# Patient Record
Sex: Female | Born: 1980 | Race: White | Hispanic: No | Marital: Married | Smoking: Never smoker
Health system: Southern US, Community
[De-identification: ages and names within clinical notes are randomized; demographics above are authoritative.]

## PROBLEM LIST (undated history)

## (undated) DIAGNOSIS — F17210 Nicotine dependence, cigarettes, uncomplicated: Secondary | ICD-10-CM

## (undated) DIAGNOSIS — F419 Anxiety disorder, unspecified: Secondary | ICD-10-CM

## (undated) DIAGNOSIS — R002 Palpitations: Secondary | ICD-10-CM

## (undated) DIAGNOSIS — N939 Abnormal uterine and vaginal bleeding, unspecified: Secondary | ICD-10-CM

## (undated) DIAGNOSIS — N83209 Unspecified ovarian cyst, unspecified side: Secondary | ICD-10-CM

## (undated) DIAGNOSIS — Z6823 Body mass index (BMI) 23.0-23.9, adult: Secondary | ICD-10-CM

## (undated) HISTORY — DX: Anxiety disorder, unspecified: F41.9

## (undated) HISTORY — PX: TUBAL LIGATION: SHX77

## (undated) HISTORY — DX: Nicotine dependence, cigarettes, uncomplicated: F17.210

## (undated) HISTORY — DX: Palpitations: R00.2

## (undated) HISTORY — DX: Body mass index (BMI) 23.0-23.9, adult: Z68.23

## (undated) HISTORY — DX: Abnormal uterine and vaginal bleeding, unspecified: N93.9

---

## 2012-07-04 ENCOUNTER — Encounter (HOSPITAL_COMMUNITY): Payer: Self-pay | Admitting: *Deleted

## 2012-07-04 ENCOUNTER — Emergency Department (HOSPITAL_COMMUNITY)
Admission: EM | Admit: 2012-07-04 | Discharge: 2012-07-04 | Disposition: A | Discharge status: Home / Self Care | Payer: Self-pay | Attending: Emergency Medicine | Admitting: Emergency Medicine

## 2012-07-04 ENCOUNTER — Emergency Department (HOSPITAL_COMMUNITY): Payer: Self-pay

## 2012-07-04 DIAGNOSIS — R110 Nausea: Secondary | ICD-10-CM | POA: Insufficient documentation

## 2012-07-04 DIAGNOSIS — N83209 Unspecified ovarian cyst, unspecified side: Secondary | ICD-10-CM | POA: Insufficient documentation

## 2012-07-04 HISTORY — DX: Unspecified ovarian cyst, unspecified side: N83.209

## 2012-07-04 LAB — URINALYSIS, ROUTINE W REFLEX MICROSCOPIC
Bilirubin Urine: NEGATIVE
Glucose, UA: NEGATIVE mg/dL
Hgb urine dipstick: NEGATIVE
Ketones, ur: NEGATIVE mg/dL
Leukocytes, UA: NEGATIVE
Nitrite: NEGATIVE
Protein, ur: NEGATIVE mg/dL
Specific Gravity, Urine: 1.023 (ref 1.005–1.030)
Urobilinogen, UA: 1 mg/dL (ref 0.0–1.0)
pH: 5 (ref 5.0–8.0)

## 2012-07-04 LAB — COMPREHENSIVE METABOLIC PANEL
ALT: 10 U/L (ref 0–35)
AST: 14 U/L (ref 0–37)
Albumin: 3.9 g/dL (ref 3.5–5.2)
Alkaline Phosphatase: 43 U/L (ref 39–117)
BUN: 9 mg/dL (ref 6–23)
CO2: 28 mEq/L (ref 19–32)
Calcium: 9.3 mg/dL (ref 8.4–10.5)
Chloride: 106 mEq/L (ref 96–112)
Creatinine, Ser: 0.71 mg/dL (ref 0.50–1.10)
GFR calc Af Amer: >90 mL/min (ref >90)
GFR calc non Af Amer: >90 mL/min (ref >90)
Glucose, Bld: 125 mg/dL — ABNORMAL HIGH (ref 70–99)
Potassium: 3.6 mEq/L (ref 3.5–5.1)
Sodium: 142 mEq/L (ref 135–145)
Total Bilirubin: 0.2 mg/dL — ABNORMAL LOW (ref 0.3–1.2)
Total Protein: 7 g/dL (ref 6.0–8.3)

## 2012-07-04 LAB — CBC WITH DIFFERENTIAL/PLATELET
Eosinophils Absolute: 0 10*3/uL (ref 0.0–0.7)
Lymphocytes Relative: 13 % (ref 12–46)
Lymphs Abs: 1.3 10*3/uL (ref 0.7–4.0)
MCH: 31.4 pg (ref 26.0–34.0)
Neutrophils Relative %: 83 % — ABNORMAL HIGH (ref 43–77)
Platelets: 169 10*3/uL (ref 150–400)
RBC: 4.2 MIL/uL (ref 3.87–5.11)
WBC: 10.6 10*3/uL — ABNORMAL HIGH (ref 4.0–10.5)

## 2012-07-04 MED ORDER — HYDROCODONE-ACETAMINOPHEN 5-325 MG PO TABS: 1.0000 | ORAL_TABLET | Freq: Three times a day (TID) | ORAL | Status: DC | PRN

## 2012-07-04 NOTE — ED Notes (Signed)
Pt brought back to room; vitals taken and pt getting into gown at this time

## 2012-07-04 NOTE — ED Notes (Signed)
Patient verbalized understanding of discharge instructions.  She has been encouraged to return to ED for any s/sx of increased pain, bleeding, dizziness or other concerns

## 2012-07-04 NOTE — ED Notes (Signed)
Patient reports she has noted right lower abd pain for 1 week post intercourse.  Patient states today her pain became significantly worse after sex.   She is also having shooting pain towards her rectum.  Patient states she is concerned that it may be a ruptured ovarian cyst.  Patient also reports nausea.  Patient last po intake was at 1330.   Patient reports normal urine output,  Normal stool.  Patient has hx of tubal ligation as well

## 2012-07-04 NOTE — ED Provider Notes (Signed)
History     CSN: [id number]  Arrival date & time 07/04/12  1533   First MD Initiated Contact with Patient 07/04/12 1547      Chief Complaint  Patient presents with  . Abdominal Pain  . Nausea    (Consider location/radiation/quality/duration/timing/severity/associated sxs/prior treatment) HPI  The patient p/w lower abdominal pain.  Pain began about one week PTA, w no clear precipitant.  Since onset there has been waxing / waning pain in the lower abd R>L.  Pain is worse following intercourse, and the patient has not taken any medication for relief this far. No upper abd pain, no persistent pain, no n/v/d, no vaginal bleeding or discharge. She states that she has a Hx of ovarian cyst rupture in the distant past.  Past Medical History  Diagnosis Date  . Ovarian cyst rupture     Past Surgical History  Procedure Laterality Date  . Tubal ligation      No family history on file.  History  Substance Use Topics  . Smoking status: Never Smoker   . Smokeless tobacco: Not on file  . Alcohol Use: Yes    OB History   Grav Para Term Preterm Abortions TAB SAB Ect Mult Living                  Review of Systems  Constitutional:       Per HPI, otherwise negative  HENT:       Per HPI, otherwise negative  Respiratory:       Per HPI, otherwise negative  Cardiovascular:       Per HPI, otherwise negative  Gastrointestinal: Positive for abdominal pain. Negative for nausea, vomiting and diarrhea.  Endocrine:       Negative aside from HPI  Genitourinary: Positive for pelvic pain. Negative for urgency, frequency, hematuria, flank pain, decreased urine volume, vaginal bleeding, vaginal discharge, vaginal pain and menstrual problem.       Occasional spotting, none recently   Musculoskeletal:       Per HPI, otherwise negative  Skin: Negative.   Neurological: Negative for syncope, weakness and numbness.    Allergies  Review of patient's allergies indicates no known  allergies.  Home Medications  No current outpatient prescriptions on file.  BP 120/80  Pulse 105  Temp(Src) 98.2 F (36.8 C) (Oral)  Resp 17  Ht 5\' 4"  (1.626 m)  Wt 125 lb (56.7 kg)  BMI 21.45 kg/m2  SpO2 100%  Physical Exam  Nursing note and vitals reviewed. Constitutional: She is oriented to person, place, and time. She appears well-developed and well-nourished. No distress.  HENT:  Head: Normocephalic and atraumatic.  Eyes: Conjunctivae and EOM are normal.  Cardiovascular: Normal rate and regular rhythm.   Pulmonary/Chest: Effort normal and breath sounds normal. No stridor. No respiratory distress.  Abdominal: She exhibits no distension. There is no hepatosplenomegaly. There is tenderness in the right lower quadrant, suprapubic area and left lower quadrant. There is no rigidity, no rebound, no guarding and no CVA tenderness.  Musculoskeletal: She exhibits no edema.  Neurological: She is alert and oriented to person, place, and time. No cranial nerve deficit.  Skin: Skin is warm and dry.  Psychiatric: She has a normal mood and affect.    ED Course  Procedures (including critical care time)  Labs Reviewed  URINALYSIS, ROUTINE W REFLEX MICROSCOPIC  CBC WITH DIFFERENTIAL  COMPREHENSIVE METABOLIC PANEL   No results found.   No diagnosis found.  6:06 PM Patient informed of all  results.  She feels well.  She now only complains of pain, mild in her left lower quadrant.  Reviewed ultrasound results with her and her husband.  Also reviewed return precautions, follow up instructions.  MDM  This young female in no distress presents with one day of lower abdominal pain.  On exam the patient is tenderness, but not peritoneal findings.  The patient's denial of any ongoing vaginal bleeding, vomiting, diarrhea, and absence of fever is reassuring for the low suspicion of acute pathology.  Given prescription of dyspareunia, lower abdominal pain, there suspicion of gynecologic etiology.   Patient's ultrasound demonstrates a complex cyst, with appropriate ovarian blood flow.  The patient was counseled on the need for gynecology followup, discharged in stable condition.  Absent any n/v/d there is little suspicion of acute GI pathology concurrently.        Gerhard Munch, MD 07/04/12 [phone]

## 2012-07-04 NOTE — ED Notes (Signed)
Patient reports her last period was the end of january

## 2014-11-18 DIAGNOSIS — R55 Syncope and collapse: Secondary | ICD-10-CM | POA: Insufficient documentation

## 2020-09-02 ENCOUNTER — Other Ambulatory Visit: Payer: Self-pay | Admitting: Family

## 2020-09-02 DIAGNOSIS — N920 Excessive and frequent menstruation with regular cycle: Secondary | ICD-10-CM

## 2020-09-02 DIAGNOSIS — E282 Polycystic ovarian syndrome: Secondary | ICD-10-CM

## 2020-09-06 ENCOUNTER — Other Ambulatory Visit: Payer: Self-pay | Admitting: Family

## 2020-09-06 DIAGNOSIS — Z1231 Encounter for screening mammogram for malignant neoplasm of breast: Secondary | ICD-10-CM

## 2020-09-06 DIAGNOSIS — I82402 Acute embolism and thrombosis of unspecified deep veins of left lower extremity: Secondary | ICD-10-CM

## 2020-09-09 ENCOUNTER — Ambulatory Visit: Payer: Self-pay

## 2020-09-13 ENCOUNTER — Ambulatory Visit
Admission: RE | Admit: 2020-09-13 | Discharge: 2020-09-13 | Disposition: A | Discharge status: Home / Self Care | Payer: 59 | Source: Ambulatory Visit | Attending: Family | Admitting: Family

## 2020-09-13 ENCOUNTER — Other Ambulatory Visit: Payer: Self-pay

## 2020-09-13 DIAGNOSIS — N920 Excessive and frequent menstruation with regular cycle: Secondary | ICD-10-CM

## 2020-09-13 DIAGNOSIS — Z1231 Encounter for screening mammogram for malignant neoplasm of breast: Secondary | ICD-10-CM

## 2020-09-13 DIAGNOSIS — E282 Polycystic ovarian syndrome: Secondary | ICD-10-CM

## 2020-09-16 ENCOUNTER — Other Ambulatory Visit: Payer: Self-pay

## 2020-11-14 DIAGNOSIS — Z789 Other specified health status: Secondary | ICD-10-CM

## 2020-11-14 DIAGNOSIS — N941 Unspecified dyspareunia: Secondary | ICD-10-CM | POA: Insufficient documentation

## 2020-11-14 DIAGNOSIS — N946 Dysmenorrhea, unspecified: Secondary | ICD-10-CM

## 2020-11-14 DIAGNOSIS — N920 Excessive and frequent menstruation with regular cycle: Secondary | ICD-10-CM | POA: Insufficient documentation

## 2020-11-14 DIAGNOSIS — R102 Pelvic and perineal pain: Secondary | ICD-10-CM

## 2021-11-01 IMAGING — MG MM DIGITAL SCREENING BILAT W/ TOMO AND CAD
6 of 10 series · 6 of 30 positions shown · non-contrast
Comparison: Previous exam(s).

CLINICAL DATA: Screening.

EXAM:
DIGITAL SCREENING BILATERAL MAMMOGRAM WITH TOMOSYNTHESIS AND CAD
TECHNIQUE: Bilateral screening digital craniocaudal and mediolateral oblique
mammograms were obtained. Bilateral screening digital breast
tomosynthesis was performed. The images were evaluated with
computer-aided detection.

[R MLO synth-2D (1 of 2)]
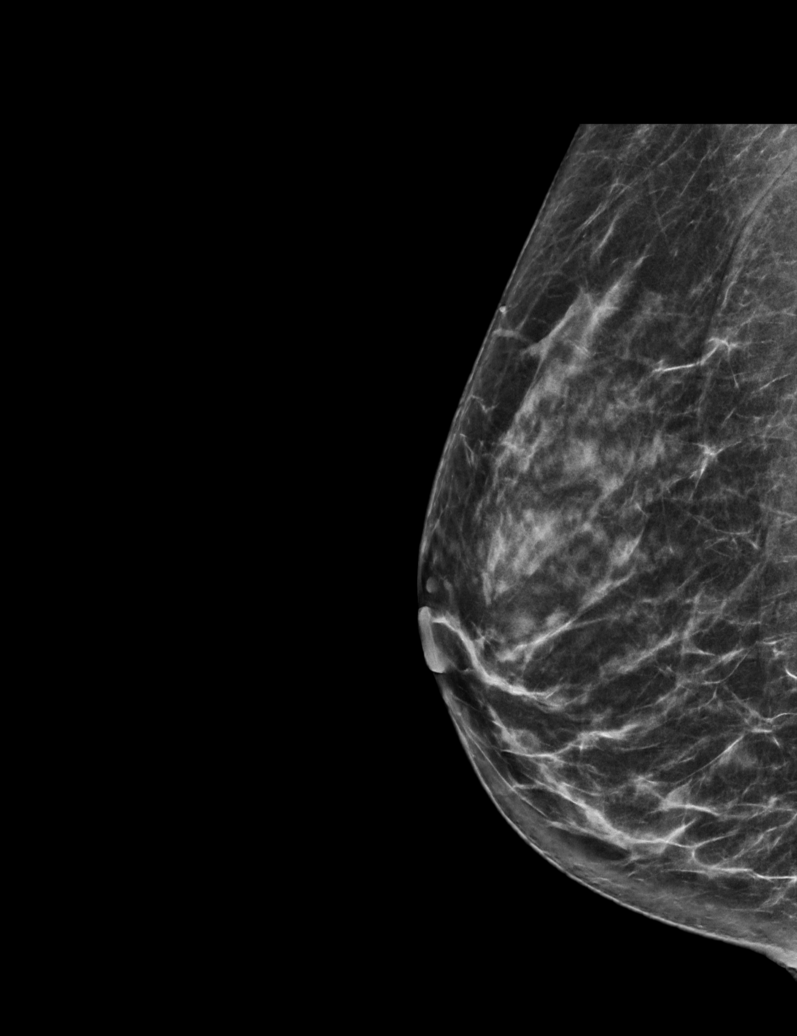

[R MLO synth-2D (2 of 2)]
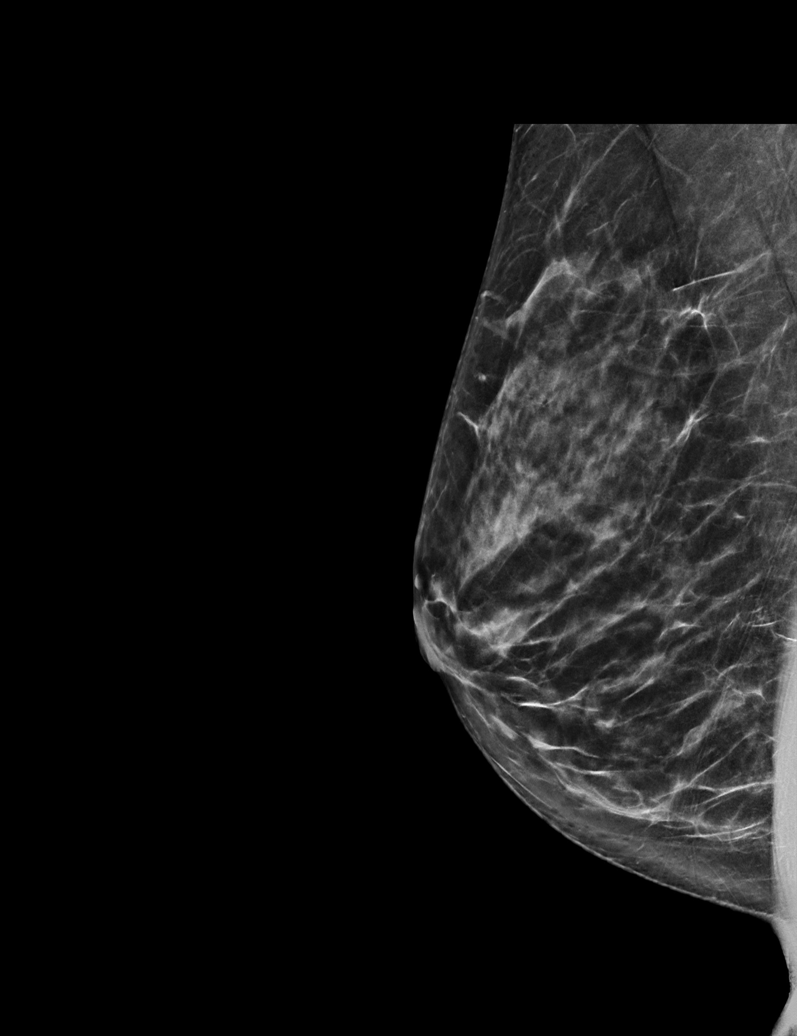

[R CC synth-2D]
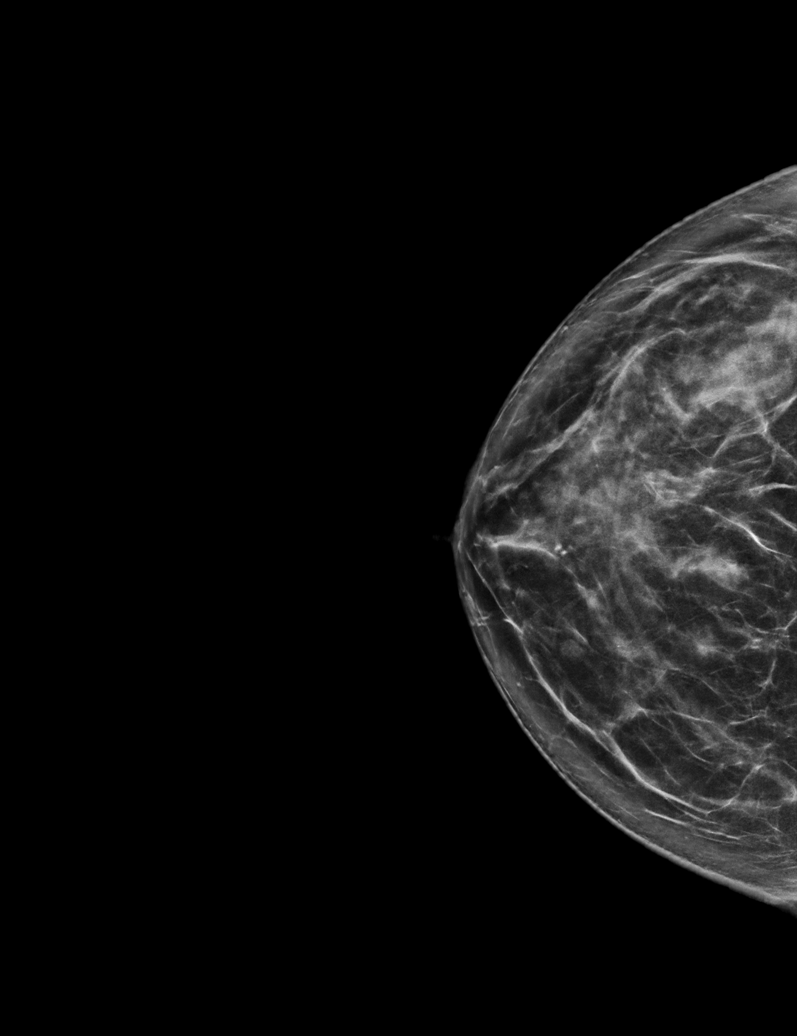

[L CC synth-2D]
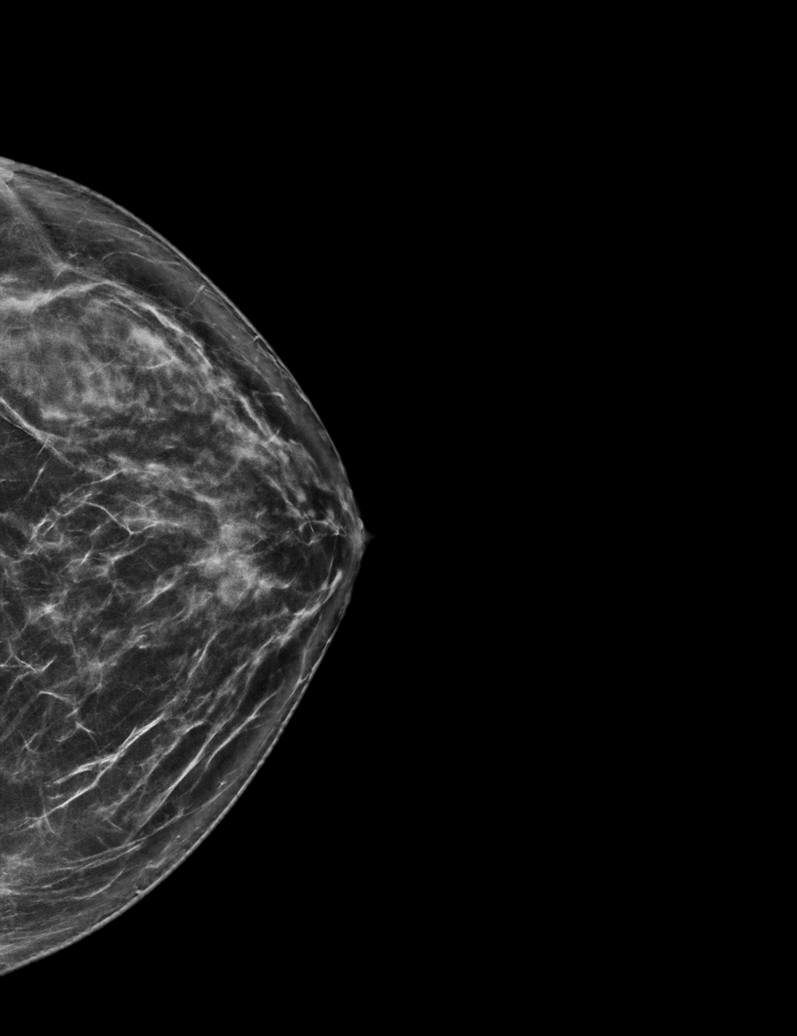

[L MLO synth-2D]
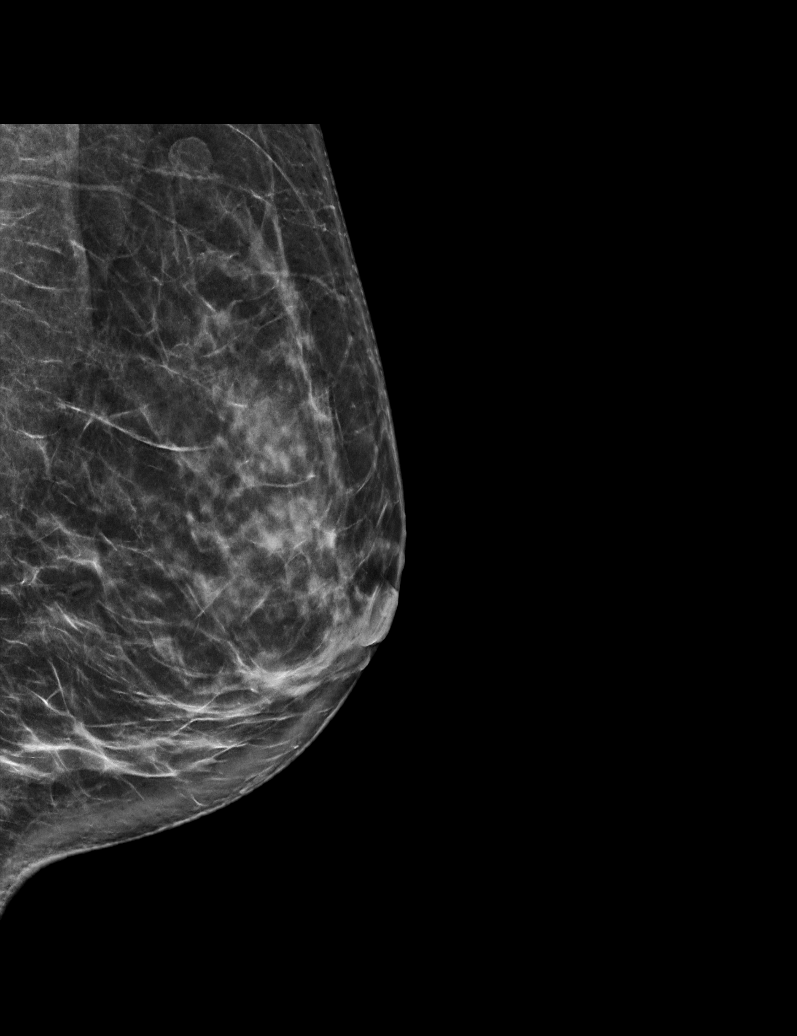

[R MLO tomo · tomo slice 31/60.0]
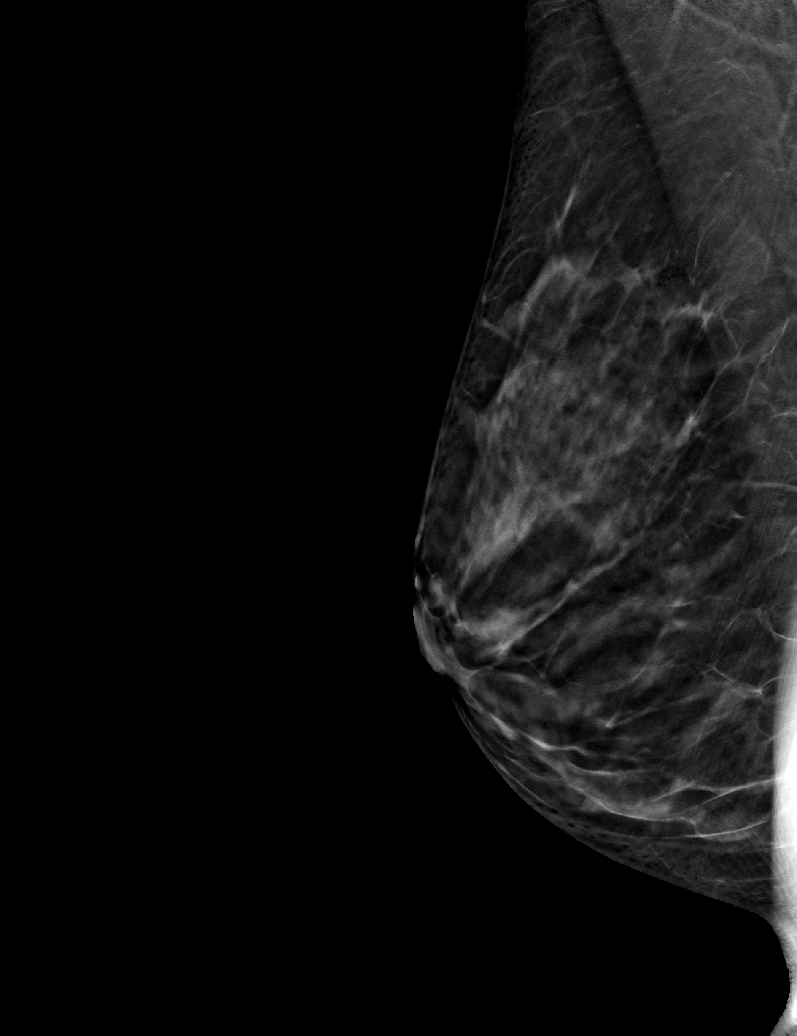

[6 of 30 positions shown; findings below may reference images not displayed]

ACR Breast Density Category c: The breast tissue is heterogeneously
dense, which may obscure small masses.
FINDINGS: There are no findings suspicious for malignancy. The images were
evaluated with computer-aided detection.
IMPRESSION: No mammographic evidence of malignancy. A result letter of this
screening mammogram will be mailed directly to the patient.

RECOMMENDATION:
Screening mammogram in one year. (Code:T4-5-GWO)

BI-RADS CATEGORY  1: Negative.

## 2021-12-05 ENCOUNTER — Other Ambulatory Visit: Payer: Self-pay

## 2021-12-05 DIAGNOSIS — R002 Palpitations: Secondary | ICD-10-CM | POA: Insufficient documentation

## 2021-12-05 DIAGNOSIS — F17210 Nicotine dependence, cigarettes, uncomplicated: Secondary | ICD-10-CM | POA: Insufficient documentation

## 2021-12-05 DIAGNOSIS — N939 Abnormal uterine and vaginal bleeding, unspecified: Secondary | ICD-10-CM | POA: Insufficient documentation

## 2021-12-05 DIAGNOSIS — N83209 Unspecified ovarian cyst, unspecified side: Secondary | ICD-10-CM | POA: Insufficient documentation

## 2021-12-05 DIAGNOSIS — Z6823 Body mass index (BMI) 23.0-23.9, adult: Secondary | ICD-10-CM | POA: Insufficient documentation

## 2021-12-05 DIAGNOSIS — F419 Anxiety disorder, unspecified: Secondary | ICD-10-CM | POA: Insufficient documentation

## 2022-01-02 ENCOUNTER — Other Ambulatory Visit: Payer: Self-pay

## 2022-01-04 ENCOUNTER — Ambulatory Visit: Payer: Commercial Managed Care - HMO | Admitting: Cardiology

## 2022-01-04 ENCOUNTER — Encounter: Payer: Self-pay | Admitting: Cardiology

## 2022-01-04 VITALS — BP 98/76 | HR 104 | Ht 64.0 in | Wt 124.8 lb

## 2022-01-04 DIAGNOSIS — R002 Palpitations: Secondary | ICD-10-CM

## 2022-01-04 DIAGNOSIS — R011 Cardiac murmur, unspecified: Secondary | ICD-10-CM

## 2022-01-04 DIAGNOSIS — F17210 Nicotine dependence, cigarettes, uncomplicated: Secondary | ICD-10-CM | POA: Diagnosis not present

## 2022-01-04 NOTE — Progress Notes (Signed)
Cardiology Office Note:    Date:  01/04/2022   ID:  Veronica Schultz, DOB 1980-06-27, MRN [id number]  PCP:  Julianne Handler, NP  Cardiologist:  Garwin Brothers, MD   Referring MD: Julianne Handler, NP    ASSESSMENT:    1. Cigarette nicotine dependence, uncomplicated   2. Murmur, cardiac   3. Palpitations   4. Cardiac murmur    PLAN:    In order of problems listed above:  Primary prevention stressed with patient.  Importance of compliance with diet and medication stressed and she vocalized understanding Cigarette smoker: I spent 5 minutes with the patient discussing solely about smoking. Smoking cessation was counseled. I suggested to the patient also different medications and pharmacological interventions. Patient is keen to try stopping on its own at this time. He will get back to me if he needs any further assistance in this matter. Palpitations and presyncope: I discussed this with her at extensive length.  I told her to keep herself well-hydrated with extra salt and water.  She has low blood pressure.  She will do this.  She tells me that the symptoms are worse when it is warm weather.  I also mention to her she can purchase a cardia for self-monitoring and she will do so. Cardiac murmur: Echocardiogram will be done to assess murmur heard on auscultation Patient will be seen in follow-up appointment in 6 months or earlier if the patient has any concerns    Medication Adjustments/Labs and Tests Ordered: Current medicines are reviewed at length with the patient today.  Concerns regarding medicines are outlined above.  Orders Placed This Encounter  Procedures   EKG 12-Lead   ECHOCARDIOGRAM COMPLETE   No orders of the defined types were placed in this encounter.    History of Present Illness:    Veronica Schultz is a 41 y.o. female who is being seen today for the evaluation of palpitations and presyncope at the request of Julianne Handler, NP.  Patient is a pleasant 41 year old female.   She has no significant past medical history from a cardiovascular standpoint.  She tells me that she has been evaluated by monitoring earlier this year for palpitations.  Nothing significant was found.  She is tells me that she has about 15 minutes of palpitations and then she feels relaxed.  She feels that she may pass out.  She has never actually passed out.  For this reason she is being sent here.  She denies any history of hypertension diabetes mellitus dyslipidemia.  Unfortunately she is a smoker since young age.  At the time of my evaluation, the patient is alert awake oriented and in no distress.  Past Medical History:  Diagnosis Date   Anxiety and depression    BMI 23.0-23.9, adult    Cigarette nicotine dependence, uncomplicated    Dysmenorrhea, unspecified 11/14/2020   Menorrhagia with regular cycle 11/14/2020   Ovarian cyst rupture    Palpitations    Pelvic pain in female 11/14/2020   Relies on tubal ligation as primary birth control method 11/14/2020   Uterine bleeding    Vasovagal syncope 11/18/2014    Past Surgical History:  Procedure Laterality Date   TUBAL LIGATION      Current Medications: Current Meds  Medication Sig   clindamycin (CLEOCIN T) 1 % SWAB Apply 1 Application topically 2 (two) times daily.   medroxyPROGESTERone Acetate 150 MG/ML SUSY Inject 1 mL into the muscle every 3 (three) months.   sertraline (ZOLOFT)  50 MG tablet Take 50 mg by mouth daily.     Allergies:   Patient has no known allergies.   Social History   Socioeconomic History   Marital status: Married    Spouse name: Not on file   Number of children: Not on file   Years of education: Not on file   Highest education level: Not on file  Occupational History   Not on file  Tobacco Use   Smoking status: Never   Smokeless tobacco: Not on file  Substance and Sexual Activity   Alcohol use: Yes   Drug use: No   Sexual activity: Not on file  Other Topics Concern   Not on file  Social History  Narrative   Not on file   Social Determinants of Health   Financial Resource Strain: Not on file  Food Insecurity: Not on file  Transportation Needs: Not on file  Physical Activity: Not on file  Stress: Not on file  Social Connections: Not on file     Family History: The patient's family history includes Arthritis in her mother; Fibromyalgia in her father.  ROS:   Please see the history of present illness.    All other systems reviewed and are negative.  EKGs/Labs/Other Studies Reviewed:    The following studies were reviewed today: I discussed my findings with the patient at length.  EKG reveals sinus rhythm and nonspecific ST-T changes   Recent Labs: No results found for requested labs within last 365 days.  Recent Lipid Panel No results found for: "CHOL", "TRIG", "HDL", "CHOLHDL", "VLDL", "LDLCALC", "LDLDIRECT"  Physical Exam:    VS:  BP 98/76   Pulse (!) 104   Ht 5\' 4"  (1.626 m)   Wt 124 lb 12.8 oz (56.6 kg)   SpO2 99%   BMI 21.42 kg/m     Wt Readings from Last 3 Encounters:  01/04/22 124 lb 12.8 oz (56.6 kg)  07/04/12 125 lb (56.7 kg)     GEN: Patient is in no acute distress HEENT: Normal NECK: No JVD; No carotid bruits LYMPHATICS: No lymphadenopathy CARDIAC: S1 S2 regular, 2/6 systolic murmur at the apex. RESPIRATORY:  Clear to auscultation without rales, wheezing or rhonchi  ABDOMEN: Soft, non-tender, non-distended MUSCULOSKELETAL:  No edema; No deformity  SKIN: Warm and dry NEUROLOGIC:  Alert and oriented x 3 PSYCHIATRIC:  Normal affect    Signed, 07/06/12, MD  01/04/2022 3:26 PM    [facility]

## 2022-01-04 NOTE — Patient Instructions (Signed)
FDA-cleared personal EKG: The world's most clinically validated personal EKG, FDA-cleared to detect Atrial Fibrillation, Bradycardia, and Tachycardia. KardiaMobile is the most reliable way to check in on your heart from home. Take your EKG from anywhere: Capture a medical-grade EKG in 30 seconds and get an instant analysis right on your smartphone. KardiaMobile is small enough to fit in your pocket, so you can take it with you anywhere. Easy to use: Simply place your fingers on the sensors--no wires, patches, or gels. Recommended by doctors: A trusted resource, KardiaMobile is the #1 doctor-recommended personal EKG with more than 100 million EKGs recorded. Save or share your EKGs: With the press of a button, email your EKGs to your doctor or save them on your phone. Works with smartphones: Compatible with most Android and iOS smartphones and tablets. Check our compatibility chart. FSA/HSA eligible: Purchase using an FSA or HSA account (please confirm coverage with your insurance provider). Phone clip included with purchase, a $15 value. Conveniently take your device with you wherever you go.  [email or web address]://[email or web address]   Step One- Record your EKG strip on Kardia Mobile app.   Step two- On Kardia EKG click "Download"   Step three- It will prompt you to make a password for this EKG. Please make the password "Revankar" so that we can view it.   Step four- Click on the little "upload" button (small box with an arrow in the middle) in the bottom left-hand corner of the screen.   Step five- Click "Save to Files"  Step six- Click on "On my iphone" and then "Pages" then press save in the top right-hand corner.   NOW GO TO MYCHART   Once on MyChart click "Messages"  Step one- Click "Send a message"  Step two- Click "Ask a medical question"   Step three- Click "Non urgent medical question"   Step four- Click on Rajan Revankar's name.  Step five- Click on the small  paperclip at the bottom of the screen  Step six- Click "Choose file"  Step seven- Pick the most recent EKG strip listed.   Once uploaded send the message!  Medication Instructions:  Your physician recommends that you continue on your current medications as directed. Please refer to the Current Medication list given to you today.  *If you need a refill on your cardiac medications before your next appointment, please call your pharmacy*   Lab Work: None ordered If you have labs (blood work) drawn today and your tests are completely normal, you will receive your results only by: MyChart Message (if you have MyChart) OR A paper copy in the mail If you have any lab test that is abnormal or we need to change your treatment, we will call you to review the results.   Testing/Procedures: Your physician has requested that you have an echocardiogram. Echocardiography is a painless test that uses sound waves to create images of your heart. It provides your doctor with information about the size and shape of your heart and how well your heart's chambers and valves are working. This procedure takes approximately one hour. There are no restrictions for this procedure.    Follow-Up: At [facility], you and your health needs are our priority.  As part of our continuing mission to provide you with exceptional heart care, we have created designated Provider Care Teams.  These Care Teams include your primary Cardiologist (physician) and Advanced Practice Providers (APPs -  Physician Assistants and Nurse Practitioners) who all work together to provide you with   the care you need, when you need it.  We recommend signing up for the patient portal called "MyChart".  Sign up information is provided on this After Visit Summary.  MyChart is used to connect with patients for Virtual Visits (Telemedicine).  Patients are able to view lab/test results, encounter notes, upcoming appointments, etc.  Non-urgent  messages can be sent to your provider as well.   To learn more about what you can do with MyChart, go to [email or web address].    Your next appointment:   9 month(s)  The format for your next appointment:   In Person  Provider:   Rajan Revankar, MD   Other Instructions Echocardiogram An echocardiogram is a test that uses sound waves (ultrasound) to produce images of the heart. Images from an echocardiogram can provide important information about: Heart size and shape. The size and thickness and movement of your heart's walls. Heart muscle function and strength. Heart valve function or if you have stenosis. Stenosis is when the heart valves are too narrow. If blood is flowing backward through the heart valves (regurgitation). A tumor or infectious growth around the heart valves. Areas of heart muscle that are not working well because of poor blood flow or injury from a heart attack. Aneurysm detection. An aneurysm is a weak or damaged part of an artery wall. The wall bulges out from the normal force of blood pumping through the body. Tell a health care provider about: Any allergies you have. All medicines you are taking, including vitamins, herbs, eye drops, creams, and over-the-counter medicines. Any blood disorders you have. Any surgeries you have had. Any medical conditions you have. Whether you are pregnant or may be pregnant. What are the risks? Generally, this is a safe test. However, problems may occur, including an allergic reaction to dye (contrast) that may be used during the test. What happens before the test? No specific preparation is needed. You may eat and drink normally. What happens during the test? You will take off your clothes from the waist up and put on a hospital gown. Electrodes or electrocardiogram (ECG)patches may be placed on your chest. The electrodes or patches are then connected to a device that monitors your heart rate and rhythm. You will lie  down on a table for an ultrasound exam. A gel will be applied to your chest to help sound waves pass through your skin. A handheld device, called a transducer, will be pressed against your chest and moved over your heart. The transducer produces sound waves that travel to your heart and bounce back (or "echo" back) to the transducer. These sound waves will be captured in real-time and changed into images of your heart that can be viewed on a video monitor. The images will be recorded on a computer and reviewed by your health care provider. You may be asked to change positions or hold your breath for a short time. This makes it easier to get different views or better views of your heart. In some cases, you may receive contrast through an IV in one of your veins. This can improve the quality of the pictures from your heart. The procedure may vary among health care providers and hospitals.   What can I expect after the test? You may return to your normal, everyday life, including diet, activities, and medicines, unless your health care provider tells you not to do that. Follow these instructions at home: It is up to you to get the results of your test. Ask   your health care provider, or the department that is doing the test, when your results will be ready. Keep all follow-up visits. This is important. Summary An echocardiogram is a test that uses sound waves (ultrasound) to produce images of the heart. Images from an echocardiogram can provide important information about the size and shape of your heart, heart muscle function, heart valve function, and other possible heart problems. You do not need to do anything to prepare before this test. You may eat and drink normally. After the echocardiogram is completed, you may return to your normal, everyday life, unless your health care provider tells you not to do that. This information is not intended to replace advice given to you by your health care  provider. Make sure you discuss any questions you have with your health care provider. Document Revised: 12/30/2019 Document Reviewed: 12/30/2019 Elsevier Patient Education  2021 [employer]   

## 2022-01-11 ENCOUNTER — Encounter: Payer: Self-pay | Admitting: Cardiology

## 2022-01-12 ENCOUNTER — Encounter: Payer: Self-pay | Admitting: Cardiology

## 2022-01-12 ENCOUNTER — Ambulatory Visit (INDEPENDENT_AMBULATORY_CARE_PROVIDER_SITE_OTHER): Payer: Commercial Managed Care - HMO

## 2022-01-12 DIAGNOSIS — R011 Cardiac murmur, unspecified: Secondary | ICD-10-CM

## 2022-01-12 LAB — ECHOCARDIOGRAM COMPLETE
Area-P 1/2: 4.49 cm2
Calc EF: 50.9 %
S' Lateral: 2.7 cm
Single Plane A2C EF: 49 %
Single Plane A4C EF: 51.6 %

## 2022-01-14 ENCOUNTER — Encounter: Payer: Self-pay | Admitting: Cardiology

## 2022-01-15 ENCOUNTER — Encounter: Payer: Self-pay | Admitting: Cardiology

## 2022-01-16 ENCOUNTER — Encounter: Payer: Self-pay | Admitting: Cardiology

## 2022-01-16 ENCOUNTER — Telehealth: Payer: Self-pay

## 2022-01-16 NOTE — Telephone Encounter (Signed)
Attempted to call patient to get more information about her symptoms she was experiencing. Patient did not answer the phone. Left message for the patient to call back.

## 2022-01-17 ENCOUNTER — Telehealth: Payer: Self-pay

## 2022-01-17 ENCOUNTER — Encounter: Payer: Self-pay | Admitting: Cardiology

## 2022-01-17 ENCOUNTER — Other Ambulatory Visit: Payer: Self-pay

## 2022-01-17 NOTE — Telephone Encounter (Signed)
Called patient and informed her of Dr. Kem Parkinson recommendation that the Kardia strips look fine and to contact her PCP regarding the symptoms she is experiencing or if the symptoms get worse to go to the ER. Patient had no further questions at this time.
